# Patient Record
Sex: Male | Born: 1985 | Race: Black or African American | Hispanic: No | Marital: Single | Smoking: Current some day smoker
Health system: Southern US, Community
[De-identification: ages and names within clinical notes are randomized; demographics above are authoritative.]

## PROBLEM LIST (undated history)

## (undated) DIAGNOSIS — S83249A Other tear of medial meniscus, current injury, unspecified knee, initial encounter: Secondary | ICD-10-CM

## (undated) DIAGNOSIS — S83519A Sprain of anterior cruciate ligament of unspecified knee, initial encounter: Secondary | ICD-10-CM

## (undated) DIAGNOSIS — R0981 Nasal congestion: Secondary | ICD-10-CM

## (undated) DIAGNOSIS — G43909 Migraine, unspecified, not intractable, without status migrainosus: Secondary | ICD-10-CM

## (undated) HISTORY — PX: ORIF MANDIBULAR FRACTURE: SHX2127

---

## 2012-11-27 ENCOUNTER — Ambulatory Visit (INDEPENDENT_AMBULATORY_CARE_PROVIDER_SITE_OTHER): Payer: Self-pay

## 2012-11-27 ENCOUNTER — Other Ambulatory Visit: Payer: Self-pay | Admitting: Adult Health

## 2012-11-27 DIAGNOSIS — S83249A Other tear of medial meniscus, current injury, unspecified knee, initial encounter: Secondary | ICD-10-CM

## 2012-11-27 DIAGNOSIS — S83519A Sprain of anterior cruciate ligament of unspecified knee, initial encounter: Secondary | ICD-10-CM

## 2012-11-27 DIAGNOSIS — R52 Pain, unspecified: Secondary | ICD-10-CM

## 2012-11-27 DIAGNOSIS — M25569 Pain in unspecified knee: Secondary | ICD-10-CM

## 2012-11-27 HISTORY — DX: Other tear of medial meniscus, current injury, unspecified knee, initial encounter: S83.249A

## 2012-11-27 HISTORY — DX: Sprain of anterior cruciate ligament of unspecified knee, initial encounter: S83.519A

## 2012-11-30 ENCOUNTER — Encounter: Payer: Self-pay | Admitting: Sports Medicine

## 2012-11-30 ENCOUNTER — Ambulatory Visit (INDEPENDENT_AMBULATORY_CARE_PROVIDER_SITE_OTHER): Payer: Self-pay | Admitting: Sports Medicine

## 2012-11-30 VITALS — BP 128/76 | HR 73 | Wt 171.0 lb

## 2012-11-30 DIAGNOSIS — M25569 Pain in unspecified knee: Secondary | ICD-10-CM

## 2012-11-30 DIAGNOSIS — S83511A Sprain of anterior cruciate ligament of right knee, initial encounter: Secondary | ICD-10-CM | POA: Insufficient documentation

## 2012-11-30 DIAGNOSIS — M25561 Pain in right knee: Secondary | ICD-10-CM

## 2012-11-30 MED ORDER — TRAMADOL HCL 50 MG PO TABS: ORAL_TABLET | ORAL | Status: AC

## 2012-11-30 NOTE — Progress Notes (Signed)
     Subjective:    I'm seeing this patient as a consultation for:  Lidia Collum, NP  CC: Right knee pain  HPI: This is a 27 year old male who presents with knee pain and swelling 2 days after a workplace injury. He was pushing a box when he hit his knee against a metal pole. He heard a "pop" at the time of the impact. There was immediate pain and swelling. He was not able to bear weight initially. He can walk short distances but reports some instability and popping of the joint. He is in significant pain even without weightbearing. The pain is severe and persistent.    Past medical history, Surgical history, Family history not pertinant except as noted below, Social history, Allergies, and medications have been entered into the medical record, reviewed, and no changes needed.   Review of Systems: No headache, visual changes, nausea, vomiting, diarrhea, constipation, dizziness, abdominal pain, skin rash, fevers, chills, night sweats, weight loss, swollen lymph nodes, body aches, joint swelling, muscle aches, chest pain, shortness of breath, mood changes, visual or auditory hallucinations.   Objective:   General: Well Developed, well nourished, and in no acute distress.  Neuro/Psych: Alert and oriented x3, extra-ocular muscles intact, able to move all 4 extremities, sensation grossly intact. Skin: Warm and dry, no rashes noted.  Respiratory: Not using accessory muscles, speaking in full sentences, trachea midline.  Cardiovascular: Pulses palpable, no extremity edema. Abdomen: Does not appear distended. Right Knee: Significant effusion but no obvious bony abnormalities. Tender to palpation along the medial joint line  ROM full in flexion but extension limited by pain. Positive Anterior Drawer sign. Solid consistent endpoints for PCL, LCL, MCL. Negative Mcmurray's, Apley's, and Thessalonian tests. Non painful patellar compression. Patellar glide without crepitus. Patellar and quadriceps  tendons unremarkable. Hamstring and quadriceps strength is normal.  Procedure: Real-time Ultrasound Guided aspiration/Injection of right knee Device: GE Logiq E  Verbal informed consent obtained.  Time-out conducted.  Noted no overlying erythema, induration, or other signs of local infection.  Skin prepped in a sterile fashion.  Local anesthesia: Topical Ethyl chloride.  With sterile technique and under real time ultrasound guidance:  40 cc of serosanguineous fluid aspirated from the right knee, syringe switched in 2 cc Kenalog 40, 4 cc lidocaine injected easily. Completed without difficulty  Pain immediately resolved suggesting accurate placement of the medication.  Advised to call if fevers/chills, erythema, induration, drainage, or persistent bleeding.  Images permanently stored and available for review in the ultrasound unit.  Impression: Technically successful ultrasound guided injection.  Impression and Recommendations:   This case required medical decision making of moderate complexity.  Assessment: This is a 27 year old male with possible ACL tear of the right knee due to trauma.  Plan: 1.Drain effusion today 2. Glucocorticoid injection for pain relief 3. Tramadol as needed for pain 4. MRI 5. Return for follow-up after MRI  This note was originally written by Karin Lieu MS3.

## 2012-11-30 NOTE — Assessment & Plan Note (Signed)
Christopher Bernard is in significant pain, I aspirated and injected the knee for pain relief. I do suspect an anterior cruciate ligament injury, we are going to get an MRI as soon as possible. Knee brace given. Tramadol as needed for pain. Return to see me to go over MRI results.

## 2012-12-04 ENCOUNTER — Other Ambulatory Visit: Payer: Self-pay

## 2012-12-04 ENCOUNTER — Telehealth: Payer: Self-pay

## 2012-12-04 ENCOUNTER — Encounter: Payer: Self-pay | Admitting: Sports Medicine

## 2012-12-04 MED ORDER — HYDROCODONE-ACETAMINOPHEN 5-325 MG PO TABS: 1.0000 | ORAL_TABLET | Freq: Three times a day (TID) | ORAL | Status: DC | PRN

## 2012-12-04 NOTE — Telephone Encounter (Signed)
Patient called stated the he needs a stronger pain medicine because what he was prescribed was not strong enough. Rhonda Cunningham,CMA

## 2012-12-04 NOTE — Telephone Encounter (Signed)
Patient has been notified that Rx was ready for pickup. Rhonda Cunningham,CMA  

## 2012-12-04 NOTE — Telephone Encounter (Signed)
Very short, one time course of hydrocodone prescribed, injection should start working very soon. Prescription is in my box

## 2012-12-08 ENCOUNTER — Ambulatory Visit (HOSPITAL_BASED_OUTPATIENT_CLINIC_OR_DEPARTMENT_OTHER)
Admission: RE | Admit: 2012-12-08 | Discharge: 2012-12-08 | Disposition: A | Discharge status: Home / Self Care | Payer: Worker's Compensation | Source: Ambulatory Visit | Attending: Sports Medicine | Admitting: Sports Medicine

## 2012-12-08 DIAGNOSIS — X58XXXA Exposure to other specified factors, initial encounter: Secondary | ICD-10-CM | POA: Insufficient documentation

## 2012-12-08 DIAGNOSIS — M2240 Chondromalacia patellae, unspecified knee: Secondary | ICD-10-CM | POA: Insufficient documentation

## 2012-12-08 DIAGNOSIS — M25469 Effusion, unspecified knee: Secondary | ICD-10-CM | POA: Insufficient documentation

## 2012-12-08 DIAGNOSIS — S83289A Other tear of lateral meniscus, current injury, unspecified knee, initial encounter: Secondary | ICD-10-CM | POA: Insufficient documentation

## 2012-12-08 DIAGNOSIS — IMO0002 Reserved for concepts with insufficient information to code with codable children: Secondary | ICD-10-CM | POA: Insufficient documentation

## 2012-12-13 ENCOUNTER — Encounter: Payer: Self-pay | Admitting: Sports Medicine

## 2012-12-13 ENCOUNTER — Ambulatory Visit (INDEPENDENT_AMBULATORY_CARE_PROVIDER_SITE_OTHER): Payer: Worker's Compensation | Admitting: Sports Medicine

## 2012-12-13 VITALS — BP 138/79 | HR 63 | Wt 166.0 lb

## 2012-12-13 DIAGNOSIS — Z5189 Encounter for other specified aftercare: Secondary | ICD-10-CM

## 2012-12-13 DIAGNOSIS — S83511D Sprain of anterior cruciate ligament of right knee, subsequent encounter: Secondary | ICD-10-CM

## 2012-12-13 NOTE — Progress Notes (Signed)
  Subjective:    CC: Followup  HPI: This is a very pleasant 27 year old male who suffered a right knee injury on November 27, 2012 at the workplace. He came to see me with swelling, pain, I aspirated hemarthrosis and injected the knee for pain control. He did have a positive Lachman's test and a positive anterior drawer sign so we obtained an MRI. The MRI results we dictated below. His pain is much better but he still has instability as well as mechanical symptoms. He is currently on light duty/seated duty.  Past medical history, Surgical history, Family history not pertinant except as noted below, Social history, Allergies, and medications have been entered into the medical record, reviewed, and no changes needed.   Review of Systems: No fevers, chills, night sweats, weight loss, chest pain, or shortness of breath.   Objective:    General: Well Developed, well nourished, and in no acute distress.  Neuro: Alert and oriented x3, extra-ocular muscles intact, sensation grossly intact.  HEENT: Normocephalic, atraumatic, pupils equal round reactive to light, neck supple, no masses, no lymphadenopathy, thyroid nonpalpable.  Skin: Warm and dry, no rashes. Cardiac: Regular rate and rhythm, no murmurs rubs or gallops, no lower extremity edema.  Respiratory: Clear to auscultation bilaterally. Not using accessory muscles, speaking in full sentences. Right Knee: Tender to palpation at the joint lines, visible and palpable effusion. ROM full in flexion and extension and lower leg rotation. Ligaments with solid consistent endpoints including PCL, LCL, MCL. He does have a positive Lachman's and positive anterior drawer sign. Painful Mcmurray's, negative Apley's, and Thessalonian tests. Non painful patellar compression. Patellar glide without crepitus. Patellar and quadriceps tendons unremarkable. Hamstring and quadriceps strength is normal.   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 There is also some tricompartmental degenerative changes with an effusion.  Impression and Recommendations:

## 2012-12-13 NOTE — Assessment & Plan Note (Signed)
MRI shows anterior cruciate ligament tear with bucket handle tear of the medial meniscus as well as a linear tear in the posterior horn of the lateral meniscus, he also has tricompartmental DJD. I aspirated and injected his knee the last visit for pain control, he returns today feeling much better. Unfortunately his injury is surgical, I'm going to do a referral to Dr. Marcene Corning for consideration of anterior cruciate ligament reconstruction. He can return to see me on an as-needed basis. 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

## 2013-01-16 ENCOUNTER — Other Ambulatory Visit: Payer: Self-pay | Admitting: Orthopedic Surgery

## 2013-01-18 ENCOUNTER — Encounter (HOSPITAL_BASED_OUTPATIENT_CLINIC_OR_DEPARTMENT_OTHER): Payer: Self-pay | Admitting: *Deleted

## 2013-01-18 DIAGNOSIS — R0981 Nasal congestion: Secondary | ICD-10-CM

## 2013-01-18 HISTORY — DX: Nasal congestion: R09.81

## 2013-01-21 NOTE — H&P (Signed)
Christopher Bernard is an 27 y.o. male.   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  Patient states he had immediate pain and swelling in his right knee.  He was seen in [place] by Dr. Benjamin Stain, plain x-rays were nondiagnostic, but MRI scan showed what appeared to be a chronic tear of the ACL, a bucket-handle tear of the medial meniscus lateral meniscal tear as well as some degenerative changes.  The patient reports pain crunching and instability in the knee and is here for definitive treatment.  Past Medical History  Diagnosis Date  . Migraines   . ACL tear 11/27/2012    right knee  . Medial meniscus tear 11/27/2012    right knee  . Nasal congestion 01/18/2013    Past Surgical History  Procedure Laterality Date  . Orif mandibular fracture      History reviewed. No pertinent family history. Social History:  reports that he has been smoking Cigarettes.  He has been smoking about 0.00 packs per day for the past 1 year. He has never used smokeless tobacco. He reports that he drinks alcohol. He reports that he does not use illicit drugs.  Allergies: No Known Allergies  No prescriptions prior to admission    No results found for this or any previous visit (from the past 48 hour(s)). No results found.  Review of Systems  Constitutional: Negative.   HENT: Negative.   Eyes: Negative.   Respiratory: Negative.   Cardiovascular: Negative.   Gastrointestinal: Negative.   Genitourinary: Negative.   Musculoskeletal: Positive for joint pain.  Skin: Negative.   Neurological: Negative.   Endo/Heme/Allergies: Negative.   Psychiatric/Behavioral: Negative.     Height 5\' 10"   (1.778 m), weight 77.111 kg (170 lb). Physical Exam  Constitutional: He is oriented to person, place, and time. He appears well-developed and well-nourished.  HENT:  Head: Normocephalic and atraumatic.  Eyes: Pupils are equal, round, and reactive to light.  Neck: Normal range of motion. Neck supple.  Cardiovascular: Intact distal pulses.   Respiratory: Effort normal.  Musculoskeletal:  The right knee is one plus effusion, collateral ligaments are stable Lachman's test is grossly positive pivot shift test cannot be accomplished secondary to guarding there is crepitus and crunching along the medial side of the knee.  As you taken through a range of motion.  Plain x-rays reviewed and are nondiagnostic, MRI scan is reviewed and does show a bucket-handle tear the medial meniscus a horizontal cleavage tear of the lateral meniscus and what appears to be an absent ACL.  Neurological: He is alert and oriented to person, place, and time.  Skin: Skin is warm and dry.  Psychiatric: He has a normal mood and affect. His behavior is normal. Judgment and thought content normal.     Assessment/Plan Assess: Right knee symptomatic ACL tear with medial meniscal bucket-handle tear and lateral meniscal horizontal cleavage tear  Plan:  The patient is 27 years old and states this is the only injury he ever had to his knee.  Surgical intervention to include autograft ACL reconstruction, medial meniscal repair or debridement.  Based on the condition of the meniscus and lateral meniscus, debridement was discussed with  the patient and is formally requested.  The patient will be out of work regular duty for 3 months after surgery, but should be qualified for light duty after 4-6 weeks.  He'll continue ibuprofen for discomfort and he'll continue seated work with frequent changes in position work restriction.  Until his knee is fixed he should specifically avoid any running, jumping or cutting activities.  PHILLIPS, ERIC  R 01/21/2013, 2:51 PM

## 2013-01-28 ENCOUNTER — Ambulatory Visit (HOSPITAL_BASED_OUTPATIENT_CLINIC_OR_DEPARTMENT_OTHER)
Admission: RE | Admit: 2013-01-28 | Discharge: 2013-01-28 | Disposition: A | Discharge status: Home / Self Care | Payer: Worker's Compensation | Source: Ambulatory Visit | Attending: Orthopedic Surgery | Admitting: Orthopedic Surgery

## 2013-01-28 ENCOUNTER — Ambulatory Visit (HOSPITAL_BASED_OUTPATIENT_CLINIC_OR_DEPARTMENT_OTHER): Payer: Worker's Compensation | Admitting: Certified Registered"

## 2013-01-28 ENCOUNTER — Encounter (HOSPITAL_BASED_OUTPATIENT_CLINIC_OR_DEPARTMENT_OTHER): Payer: Self-pay | Admitting: Certified Registered"

## 2013-01-28 ENCOUNTER — Encounter (HOSPITAL_BASED_OUTPATIENT_CLINIC_OR_DEPARTMENT_OTHER): Payer: Worker's Compensation | Admitting: Certified Registered"

## 2013-01-28 ENCOUNTER — Encounter (HOSPITAL_BASED_OUTPATIENT_CLINIC_OR_DEPARTMENT_OTHER)
Admission: RE | Disposition: A | Discharge status: Home / Self Care | Payer: Self-pay | Source: Ambulatory Visit | Attending: Orthopedic Surgery

## 2013-01-28 DIAGNOSIS — S83509A Sprain of unspecified cruciate ligament of unspecified knee, initial encounter: Secondary | ICD-10-CM | POA: Insufficient documentation

## 2013-01-28 DIAGNOSIS — IMO0002 Reserved for concepts with insufficient information to code with codable children: Secondary | ICD-10-CM | POA: Insufficient documentation

## 2013-01-28 DIAGNOSIS — S83511A Sprain of anterior cruciate ligament of right knee, initial encounter: Secondary | ICD-10-CM

## 2013-01-28 DIAGNOSIS — S83289A Other tear of lateral meniscus, current injury, unspecified knee, initial encounter: Secondary | ICD-10-CM | POA: Insufficient documentation

## 2013-01-28 DIAGNOSIS — F17200 Nicotine dependence, unspecified, uncomplicated: Secondary | ICD-10-CM | POA: Insufficient documentation

## 2013-01-28 HISTORY — DX: Other tear of medial meniscus, current injury, unspecified knee, initial encounter: S83.249A

## 2013-01-28 HISTORY — DX: Nasal congestion: R09.81

## 2013-01-28 HISTORY — DX: Migraine, unspecified, not intractable, without status migrainosus: G43.909

## 2013-01-28 HISTORY — DX: Sprain of anterior cruciate ligament of unspecified knee, initial encounter: S83.519A

## 2013-01-28 HISTORY — PX: KNEE ARTHROSCOPY WITH MEDIAL MENISECTOMY: SHX5651

## 2013-01-28 HISTORY — PX: ANTERIOR CRUCIATE LIGAMENT REPAIR: SHX115

## 2013-01-28 SURGERY — RECONSTRUCTION, KNEE, ACL
Anesthesia: General | Site: Knee | Laterality: Right

## 2013-01-28 MED ORDER — MIDAZOLAM HCL 2 MG/2ML IJ SOLN
1.0000 mg | INTRAMUSCULAR | Status: DC | PRN
  Administered 2013-01-28: 2 mg via INTRAVENOUS

## 2013-01-28 MED ORDER — OXYCODONE HCL 5 MG/5ML PO SOLN
5.0000 mg | Freq: Once | ORAL | Status: AC | PRN
Start: 2013-01-28 — End: 2013-01-28

## 2013-01-28 MED ORDER — DEXAMETHASONE SODIUM PHOSPHATE 10 MG/ML IJ SOLN
INTRAMUSCULAR | Status: DC | PRN
  Administered 2013-01-28: 10 mg via INTRAVENOUS

## 2013-01-28 MED ORDER — LIDOCAINE-EPINEPHRINE (PF) 1 %-1:200000 IJ SOLN
INTRAMUSCULAR | Status: AC
  Filled 2013-01-28: qty 10

## 2013-01-28 MED ORDER — FENTANYL CITRATE 0.05 MG/ML IJ SOLN
INTRAMUSCULAR | Status: DC | PRN
  Administered 2013-01-28 (×2): 25 ug via INTRAVENOUS
  Administered 2013-01-28: 50 ug via INTRAVENOUS
  Administered 2013-01-28: 25 ug via INTRAVENOUS
  Administered 2013-01-28: 50 ug via INTRAVENOUS
  Administered 2013-01-28 (×2): 25 ug via INTRAVENOUS

## 2013-01-28 MED ORDER — OXYCODONE-ACETAMINOPHEN 5-325 MG PO TABS: 1.0000 | ORAL_TABLET | ORAL | Status: AC | PRN

## 2013-01-28 MED ORDER — SODIUM CHLORIDE 0.9 % IR SOLN
Status: DC | PRN
  Administered 2013-01-28: 09:00:00

## 2013-01-28 MED ORDER — HYDROMORPHONE HCL PF 1 MG/ML IJ SOLN: 0.2500 mg | INTRAMUSCULAR | Status: DC | PRN

## 2013-01-28 MED ORDER — FENTANYL CITRATE 0.05 MG/ML IJ SOLN
50.0000 ug | INTRAMUSCULAR | Status: DC | PRN
  Administered 2013-01-28: 100 ug via INTRAVENOUS

## 2013-01-28 MED ORDER — PROMETHAZINE HCL 25 MG/ML IJ SOLN: 6.2500 mg | INTRAMUSCULAR | Status: DC | PRN

## 2013-01-28 MED ORDER — ONDANSETRON HCL 4 MG/2ML IJ SOLN
INTRAMUSCULAR | Status: DC | PRN
  Administered 2013-01-28: 4 mg via INTRAVENOUS

## 2013-01-28 MED ORDER — PROPOFOL 10 MG/ML IV EMUL
INTRAVENOUS | Status: AC
  Filled 2013-01-28: qty 50

## 2013-01-28 MED ORDER — BUPIVACAINE HCL (PF) 0.5 % IJ SOLN
INTRAMUSCULAR | Status: AC
  Filled 2013-01-28: qty 30

## 2013-01-28 MED ORDER — EPINEPHRINE HCL 1 MG/ML IJ SOLN
INTRAMUSCULAR | Status: AC
  Filled 2013-01-28: qty 1

## 2013-01-28 MED ORDER — FENTANYL CITRATE 0.05 MG/ML IJ SOLN
INTRAMUSCULAR | Status: AC
  Filled 2013-01-28: qty 8

## 2013-01-28 MED ORDER — MIDAZOLAM HCL 2 MG/ML PO SYRP: 12.0000 mg | ORAL_SOLUTION | Freq: Once | ORAL | Status: DC | PRN

## 2013-01-28 MED ORDER — OXYCODONE HCL 5 MG PO TABS
ORAL_TABLET | ORAL | Status: AC
  Filled 2013-01-28: qty 1

## 2013-01-28 MED ORDER — DEXTROSE-NACL 5-0.45 % IV SOLN: INTRAVENOUS | Status: DC

## 2013-01-28 MED ORDER — MIDAZOLAM HCL 5 MG/5ML IJ SOLN
INTRAMUSCULAR | Status: DC | PRN
  Administered 2013-01-28: 2 mg via INTRAVENOUS

## 2013-01-28 MED ORDER — CEFAZOLIN SODIUM-DEXTROSE 2-3 GM-% IV SOLR
2.0000 g | INTRAVENOUS | Status: AC
  Administered 2013-01-28: 2 g via INTRAVENOUS

## 2013-01-28 MED ORDER — MIDAZOLAM HCL 2 MG/2ML IJ SOLN
INTRAMUSCULAR | Status: AC
  Filled 2013-01-28: qty 2

## 2013-01-28 MED ORDER — CEFAZOLIN SODIUM 1-5 GM-% IV SOLN
INTRAVENOUS | Status: AC
  Filled 2013-01-28: qty 100

## 2013-01-28 MED ORDER — OXYCODONE HCL 5 MG PO TABS
5.0000 mg | ORAL_TABLET | Freq: Once | ORAL | Status: AC | PRN
  Administered 2013-01-28: 5 mg via ORAL

## 2013-01-28 MED ORDER — LIDOCAINE HCL (CARDIAC) 20 MG/ML IV SOLN
INTRAVENOUS | Status: DC | PRN
  Administered 2013-01-28: 60 mg via INTRAVENOUS

## 2013-01-28 MED ORDER — CHLORHEXIDINE GLUCONATE 4 % EX LIQD
60.0000 mL | Freq: Once | CUTANEOUS | Status: AC
  Administered 2013-01-28: 4 via TOPICAL

## 2013-01-28 MED ORDER — BUPIVACAINE-EPINEPHRINE PF 0.5-1:200000 % IJ SOLN
INTRAMUSCULAR | Status: DC | PRN
  Administered 2013-01-28: 30 mL via PERINEURAL

## 2013-01-28 MED ORDER — FENTANYL CITRATE 0.05 MG/ML IJ SOLN
INTRAMUSCULAR | Status: AC
  Filled 2013-01-28: qty 2

## 2013-01-28 MED ORDER — BUPIVACAINE-EPINEPHRINE PF 0.5-1:200000 % IJ SOLN
INTRAMUSCULAR | Status: AC
  Filled 2013-01-28: qty 30

## 2013-01-28 MED ORDER — LIDOCAINE HCL (PF) 1 % IJ SOLN
INTRAMUSCULAR | Status: AC
  Filled 2013-01-28: qty 30

## 2013-01-28 MED ORDER — PROPOFOL 10 MG/ML IV BOLUS
INTRAVENOUS | Status: DC | PRN
  Administered 2013-01-28: 200 mg via INTRAVENOUS

## 2013-01-28 MED ORDER — LACTATED RINGERS IV SOLN
INTRAVENOUS | Status: DC
  Administered 2013-01-28 (×2): via INTRAVENOUS

## 2013-01-28 SURGICAL SUPPLY — 75 items
BANDAGE ELASTIC 4 VELCRO ST LF (GAUZE/BANDAGES/DRESSINGS) ×2 IMPLANT
BANDAGE ELASTIC 6 VELCRO ST LF (GAUZE/BANDAGES/DRESSINGS) ×2 IMPLANT
BLADE 4.2CUDA (BLADE) IMPLANT
BLADE AVERAGE 25X9 (BLADE) IMPLANT
BLADE CUTTER GATOR 3.5 (BLADE) IMPLANT
BLADE GREAT WHITE 4.2 (BLADE) ×2 IMPLANT
BLADE OSCIL/SAGITTAL W/10 ST (BLADE) ×2 IMPLANT
BLADE SURG 10 STRL SS (BLADE) ×4 IMPLANT
BLADE SURG 15 STRL LF DISP TIS (BLADE) ×1 IMPLANT
BLADE SURG 15 STRL SS (BLADE) ×1
BNDG ESMARK 4X9 LF (GAUZE/BANDAGES/DRESSINGS) IMPLANT
BUR VERTEX HOODED 4.5 (BURR) ×2 IMPLANT
CANISTER SUCT 3000ML (MISCELLANEOUS) IMPLANT
CANISTER SUCT LVC 12 LTR MEDI- (MISCELLANEOUS) ×2 IMPLANT
CHLORAPREP W/TINT 26ML (MISCELLANEOUS) ×2 IMPLANT
COVER TABLE BACK 60X90 (DRAPES) ×2 IMPLANT
CUFF TOURNIQUET SINGLE 34IN LL (TOURNIQUET CUFF) IMPLANT
DECANTER SPIKE VIAL GLASS SM (MISCELLANEOUS) IMPLANT
DRAPE ARTHROSCOPY W/POUCH 114 (DRAPES) ×2 IMPLANT
ELECT MENISCUS 165MM 90D (ELECTRODE) IMPLANT
ELECT REM PT RETURN 9FT ADLT (ELECTROSURGICAL) ×2
ELECTRODE REM PT RTRN 9FT ADLT (ELECTROSURGICAL) ×1 IMPLANT
GAUZE SPONGE 4X4 16PLY XRAY LF (GAUZE/BANDAGES/DRESSINGS) ×2 IMPLANT
GAUZE XEROFORM 1X8 LF (GAUZE/BANDAGES/DRESSINGS) ×2 IMPLANT
GLOVE BIO SURGEON STRL SZ 6 (GLOVE) IMPLANT
GLOVE BIO SURGEON STRL SZ7.5 (GLOVE) ×2 IMPLANT
GLOVE BIO SURGEON STRL SZ8.5 (GLOVE) ×4 IMPLANT
GLOVE BIOGEL PI IND STRL 7.0 (GLOVE) ×1 IMPLANT
GLOVE BIOGEL PI IND STRL 8 (GLOVE) ×1 IMPLANT
GLOVE BIOGEL PI IND STRL 9 (GLOVE) ×1 IMPLANT
GLOVE BIOGEL PI INDICATOR 7.0 (GLOVE) ×1
GLOVE BIOGEL PI INDICATOR 8 (GLOVE) ×1
GLOVE BIOGEL PI INDICATOR 9 (GLOVE) ×1
GLOVE ECLIPSE 6.5 STRL STRAW (GLOVE) ×2 IMPLANT
GLOVE EXAM NITRILE PF MED BLUE (GLOVE) ×2 IMPLANT
GOWN PREVENTION PLUS XLARGE (GOWN DISPOSABLE) ×4 IMPLANT
GOWN PREVENTION PLUS XXLARGE (GOWN DISPOSABLE) ×2 IMPLANT
IMMOBILIZER KNEE 22 UNIV (SOFTGOODS) IMPLANT
IMMOBILIZER KNEE 24 THIGH 36 (MISCELLANEOUS) IMPLANT
IMMOBILIZER KNEE 24 UNIV (MISCELLANEOUS)
IV NS IRRIG 3000ML ARTHROMATIC (IV SOLUTION) ×4 IMPLANT
KNEE WRAP E Z 3 GEL PACK (MISCELLANEOUS) ×2 IMPLANT
NDL SAFETY ECLIPSE 18X1.5 (NEEDLE) ×1 IMPLANT
NEEDLE HYPO 18GX1.5 SHARP (NEEDLE) ×1
NEEDLE MENISCAL REPAIR W/EYELT (NEEDLE) IMPLANT
NS IRRIG 1000ML POUR BTL (IV SOLUTION) IMPLANT
PACK ARTHROSCOPY DSU (CUSTOM PROCEDURE TRAY) ×2 IMPLANT
PACK BASIN DAY SURGERY FS (CUSTOM PROCEDURE TRAY) ×2 IMPLANT
PAD CAST 4YDX4 CTTN HI CHSV (CAST SUPPLIES) ×1 IMPLANT
PADDING CAST COTTON 4X4 STRL (CAST SUPPLIES) ×1
PENCIL BUTTON HOLSTER BLD 10FT (ELECTRODE) ×2 IMPLANT
SCREW PROPEL 7X20MM (Screw) ×4 IMPLANT
SET ARTHROSCOPY TUBING (MISCELLANEOUS) ×1
SET ARTHROSCOPY TUBING LN (MISCELLANEOUS) ×1 IMPLANT
SLEEVE SCD COMPRESS KNEE MED (MISCELLANEOUS) ×2 IMPLANT
SPONGE GAUZE 4X4 12PLY (GAUZE/BANDAGES/DRESSINGS) ×2 IMPLANT
SPONGE LAP 4X18 X RAY DECT (DISPOSABLE) ×4 IMPLANT
STAPLER VISISTAT 35W (STAPLE) IMPLANT
SUCTION FRAZIER TIP 10 FR DISP (SUCTIONS) IMPLANT
SUT ETHILON 5 0 PS 2 18 (SUTURE) IMPLANT
SUT FIBERWIRE #2 38 T-5 BLUE (SUTURE)
SUT MON AB 4-0 PC3 18 (SUTURE) ×2 IMPLANT
SUT PDS AB 4-0 P3 18 (SUTURE) IMPLANT
SUT STEEL 5 (SUTURE) ×2 IMPLANT
SUT VIC AB 2-0 SH 27 (SUTURE) ×1
SUT VIC AB 2-0 SH 27XBRD (SUTURE) ×1 IMPLANT
SUT VIC AB 3-0 PS1 18 (SUTURE)
SUT VIC AB 3-0 PS1 18XBRD (SUTURE) IMPLANT
SUTURE FIBERWR #2 38 T-5 BLUE (SUTURE) IMPLANT
SYR 3ML 18GX1 1/2 (SYRINGE) IMPLANT
SYR 5ML LL (SYRINGE) ×2 IMPLANT
TOWEL OR 17X24 6PK STRL BLUE (TOWEL DISPOSABLE) ×6 IMPLANT
WAND STAR VAC 90 (SURGICAL WAND) ×2 IMPLANT
WATER STERILE IRR 1000ML POUR (IV SOLUTION) ×2 IMPLANT
YANKAUER SUCT BULB TIP NO VENT (SUCTIONS) IMPLANT

## 2013-01-28 NOTE — Interval H&P Note (Signed)
History and Physical Interval Note:  01/28/2013 7:22 AM  Christopher Bernard  has presented today for surgery, with the diagnosis of right ACL tear and Medial Meniscal Tear  The various methods of treatment have been discussed with the patient and family. After consideration of risks, benefits and other options for treatment, the patient has consented to  Procedure(s): RECONSTRUCTION ANTERIOR CRUCIATE LIGAMENT (ACL) RIGHT  AUTOGRAFT (Right) RIGHT KNEE ARTHROSCOPY WITH PARTIAL MEDIAL MENISECTOMY (Right) as a surgical intervention .  The patient's history has been reviewed, patient examined, no change in status, stable for surgery.  I have reviewed the patient's chart and labs.  Questions were answered to the patient's satisfaction.     [name]

## 2013-01-28 NOTE — Transfer of Care (Signed)
Immediate Anesthesia Transfer of Care Note  Patient: Christopher Bernard  Procedure(s) Performed: Procedure(s): RECONSTRUCTION ANTERIOR CRUCIATE LIGAMENT (ACL) RIGHT  AUTOGRAFT (Right) RIGHT KNEE ARTHROSCOPY WITH PARTIAL MEDIAL MENISECTOMY, Partial Lateral Menisectomy (Right)  Patient Location: PACU  Anesthesia Type:GA combined with regional for post-op pain  Level of Consciousness: awake, alert , oriented and patient cooperative  Airway & Oxygen Therapy: Patient Spontanous Breathing and Patient connected to face mask oxygen  Post-op Assessment: Report given to PACU RN and Post -op Vital signs reviewed and stable  Post vital signs: Reviewed and stable  Complications: No apparent anesthesia complications

## 2013-01-28 NOTE — Progress Notes (Signed)
Assisted Dr. Massagee with right, ultrasound guided, femoral block. Side rails up, monitors on throughout procedure. See vital signs in flow sheet. Tolerated Procedure well. 

## 2013-01-28 NOTE — Op Note (Signed)
Pre-Op Dx: Right knee anterior cruciate ligament tear, medial meniscal tear bucket-handle, lateral meniscus tear complex double parrot-beak   Postop Dx: Same   Procedure: Left knee partial arthroscopic medial meniscectomy, lateral meniscectomy, autograft anterior cruciate ligament reconstruction with a 11 mm wide autograft and 2 7 x 20 Linvatec propel titanium screws for fixation  Surgeon: Feliberto Gottron. Turner Daniels M.D.  Assist: Tomi Likens. Gaylene Brooks  (present throughout entire procedure and necessary for timely completion of the procedure) Anes: General LMA  EBL: Minimal  Fluids: 800 cc   Indications: Patient was injured at work MRI scan showed what appeared to be a chronic anterior cruciate ligament tear bucket-handle tear the medial meniscus and lateral meniscal tear. Patient has catching popping and pain in his knee. Pt has failed conservative treatment with anti-inflammatory medicines, physical therapy, and modified activites but did get good temporarily from an intra-articular cortisone injection. Pain has recurred and patient desires elective arthroscopic evaluation and treatment of knee. This will include autograft anterior cruciate ligament reconstruction and repair or removal of the meniscal tears based on the complexity and chronicity. Risks and benefits of surgery have been discussed and questions answered.  Procedure: Patient identified by arm band and taken to the operating room at the day surgery Center. The appropriate anesthetic monitors were attached, and General LMA anesthesia was induced without difficulty. Lateral post was applied to the table and the lower extremity was prepped and draped in usual sterile fashion from the ankle to the midthigh. Time out procedure was performed. We began the operation by making standard inferior lateral and inferior medial peripatellar portals with a #11 blade allowing introduction of the arthroscope through the inferior lateral portal and the out flow to the  inferior medial portal. Pump pressure was set at 100 mmHg and diagnostic arthroscopy  revealed a normal patellofemoral joint, moving into the medial compartment the patient had an old white on white bucket-handle tear the medial meniscus was displaced and had some atrophy of the meniscus. The meniscal tear was removed with the arthroscopic scissors 11 blade straight biter and a 42 great white sucker shaver. The outer 20% of the meniscus was intact. Moving into the notch the patient did have a chronic anterior cruciate ligament tear with an empty lateral wall sign. On the lateral side he had a complex double parrot-beak tear again this appeared to be chronic and this was removed piecemeal with a straight biter side biter 42 gray-white sucker shaver and a 35 Gator sucker shaver. The knee was then flexed to 50 and a Stulberg positioner and using a 45 hooded vortex bur a superolateral notchplasty was accomplished with removal of notch osteophytes are 1-2 mm in size. We then harvested 11 mm wide bone tendon bone graft utilizing the anterior incision that went from the patella to 1 cm medial to and 1 cm distal to the tibial tubercle. Small bleeders in the skin and subcutaneous tissue identified and cauterized the patellar tendon was measured at 34 mm we then proceeded to harvest the graft with 20 mm bone plugs at either end the patellar bone plug after being trimmed to fit through the 8 mm tube and the tibial bone plug fit through the 9 mm tube. Drill holes are placed each bone plug with a #2 FiberWire and the patellar bone plug and a 20-gauge steel wire and the tibial bone plug. With the knee held at 50 flexion we then used the Linvatec tibial pin positioning guide to place a tunnel from the  proximal medial flare of the tibia to the posterior aspect of the anterior cruciate ligament footprint and overreamed with a 9 mm badger reamer. The 7 mm over-the-top guide was placed the tibial tunnel and a second guidepin placed  at the 10 to 10:30 position and overreamed with an 8 mm badger reamer to a depth of 35 mm. A superior notch was placed in the femoral tunnel and a lateral notch and the tibial tunnel the Beath pin was then drilled through the femoral tunnel and out the superior lateral side of the femur allowing [place] to pull the graft through the tibial tunnel into the femoral tunnel patellar bone plug first with good fit and fill. The knee was hyperflexed 110 degrees and utilizing a supplemental inferomedial portal and the wire was placed between the patellar bone plug in the femoral tunnel and then fixed with a 7 x 20 Linvatec propel screw using a graft protector. The knee was then placed in 40 of flexion and with reverse Lachman's maneuver a second 7 x 20 screw was placed between the tibial bone plug in the tibia over a second Nitinol wire. Excellent graft tension was noted the knee was taken through range of motion from 110-45-0 there was no impingement noted and graft tension was excellent the. At this point the wound was irrigated out normal saline solution a 20-gauge steel wire used for traction on the graft was cut and removed as was the #2 FiberWire. The wound is closed in layers with 3-0 Vicryl suture in the retinaculum subcutaneous tissue and subcuticular. The knee was irrigated out normal saline solution. A dressing of xerofoam 4 x 4 dressing sponges, web roll and an Ace wrap was applied. The patient was awakened extubated and taken to the recovery without difficulty.    Signed: [name], MD

## 2013-01-28 NOTE — Anesthesia Postprocedure Evaluation (Signed)
  Anesthesia Post-op Note  Patient: Christopher Bernard  Procedure(s) Performed: Procedure(s): RECONSTRUCTION ANTERIOR CRUCIATE LIGAMENT (ACL) RIGHT  AUTOGRAFT (Right) RIGHT KNEE ARTHROSCOPY WITH PARTIAL MEDIAL MENISECTOMY, Partial Lateral Menisectomy (Right)  Patient Location: PACU  Anesthesia Type:GA combined with regional for post-op pain  Level of Consciousness: awake and alert   Airway and Oxygen Therapy: Patient Spontanous Breathing  Post-op Pain: none  Post-op Assessment: Post-op Vital signs reviewed  Post-op Vital Signs: stable  Complications: No apparent anesthesia complications

## 2013-01-28 NOTE — Anesthesia Procedure Notes (Addendum)
Anesthesia Regional Block:  Femoral nerve block  Pre-Anesthetic Checklist: ,, timeout performed, Correct Patient, Correct Site, Correct Laterality, Correct Procedure, Correct Position, site marked, Risks and benefits discussed, at surgeon's request and post-op pain management  Laterality: Right and Upper  Prep: chloraprep       Needles:  Injection technique: Single-shot  Needle Type: Echogenic Needle      Needle Gauge: 22 and 22 G  Needle insertion depth: 6 cm   Additional Needles:  Procedures: nerve stimulator Femoral nerve block  Nerve Stimulator or Paresthesia:  Response: Twitch elicited, 0.8 mA,   Additional Responses:   Narrative:  Start time: 01/28/2013 7:10 AM End time: 01/28/2013 7:24 AM Injection made incrementally with aspirations every 5 mL.  Performed by: Personally  Anesthesiologist: Alma Friendly, MD  Additional Notes: BP cuff, EKG monitors applied. Sedation begun. Femoral artery palpated for location of nerve. After nerve location anesthetic injected incrementally, slowly , and after neg aspirations. Tolerated well.   Procedure Name: LMA Insertion Date/Time: 01/28/2013 7:36 AM Performed by: Patryk Conant Pre-anesthesia Checklist: Patient identified, Emergency Drugs available, Suction available and Patient being monitored Patient Re-evaluated:Patient Re-evaluated prior to inductionOxygen Delivery Method: Circle System Utilized Preoxygenation: Pre-oxygenation with 100% oxygen Intubation Type: IV induction Ventilation: Mask ventilation without difficulty LMA: LMA inserted LMA Size: 4.0 Number of attempts: 1 Airway Equipment and Method: bite block Placement Confirmation: positive ETCO2 Tube secured with: Tape Dental Injury: Teeth and Oropharynx as per pre-operative assessment

## 2013-01-28 NOTE — Anesthesia Preprocedure Evaluation (Addendum)
Anesthesia Evaluation  Patient identified by MRN, date of birth, ID band Patient awake    Reviewed: Allergy & Precautions, NPO status   Airway Mallampati: I      Dental  (+) Teeth Intact   Pulmonary Current Smoker,  breath sounds clear to auscultation        Cardiovascular Rhythm:Regular Rate:Normal     Neuro/Psych  Headaches,    GI/Hepatic   Endo/Other    Renal/GU      Musculoskeletal   Abdominal   Peds  Hematology   Anesthesia Other Findings   Reproductive/Obstetrics                         Anesthesia Physical Anesthesia Plan  ASA: II  Anesthesia Plan: General   Post-op Pain Management:    Induction: Intravenous  Airway Management Planned: LMA and Oral ETT  Additional Equipment:   Intra-op Plan:   Post-operative Plan: Extubation in OR  Informed Consent: I have reviewed the patients History and Physical, chart, labs and discussed the procedure including the risks, benefits and alternatives for the proposed anesthesia with the patient or authorized representative who has indicated his/her understanding and acceptance.     Plan Discussed with: CRNA and Surgeon  Anesthesia Plan Comments:        Anesthesia Quick Evaluation

## 2013-01-29 ENCOUNTER — Encounter (HOSPITAL_BASED_OUTPATIENT_CLINIC_OR_DEPARTMENT_OTHER): Payer: Self-pay | Admitting: Orthopedic Surgery

## 2015-02-05 IMAGING — CR DG KNEE COMPLETE 4+V*R*
4 series · 4 of 4 positions shown · non-contrast
Comparison: None.

CLINICAL DATA: Right knee pain post injury

EXAM:
RIGHT KNEE - COMPLETE 4+ VIEW

[view not recorded (1 of 4)]
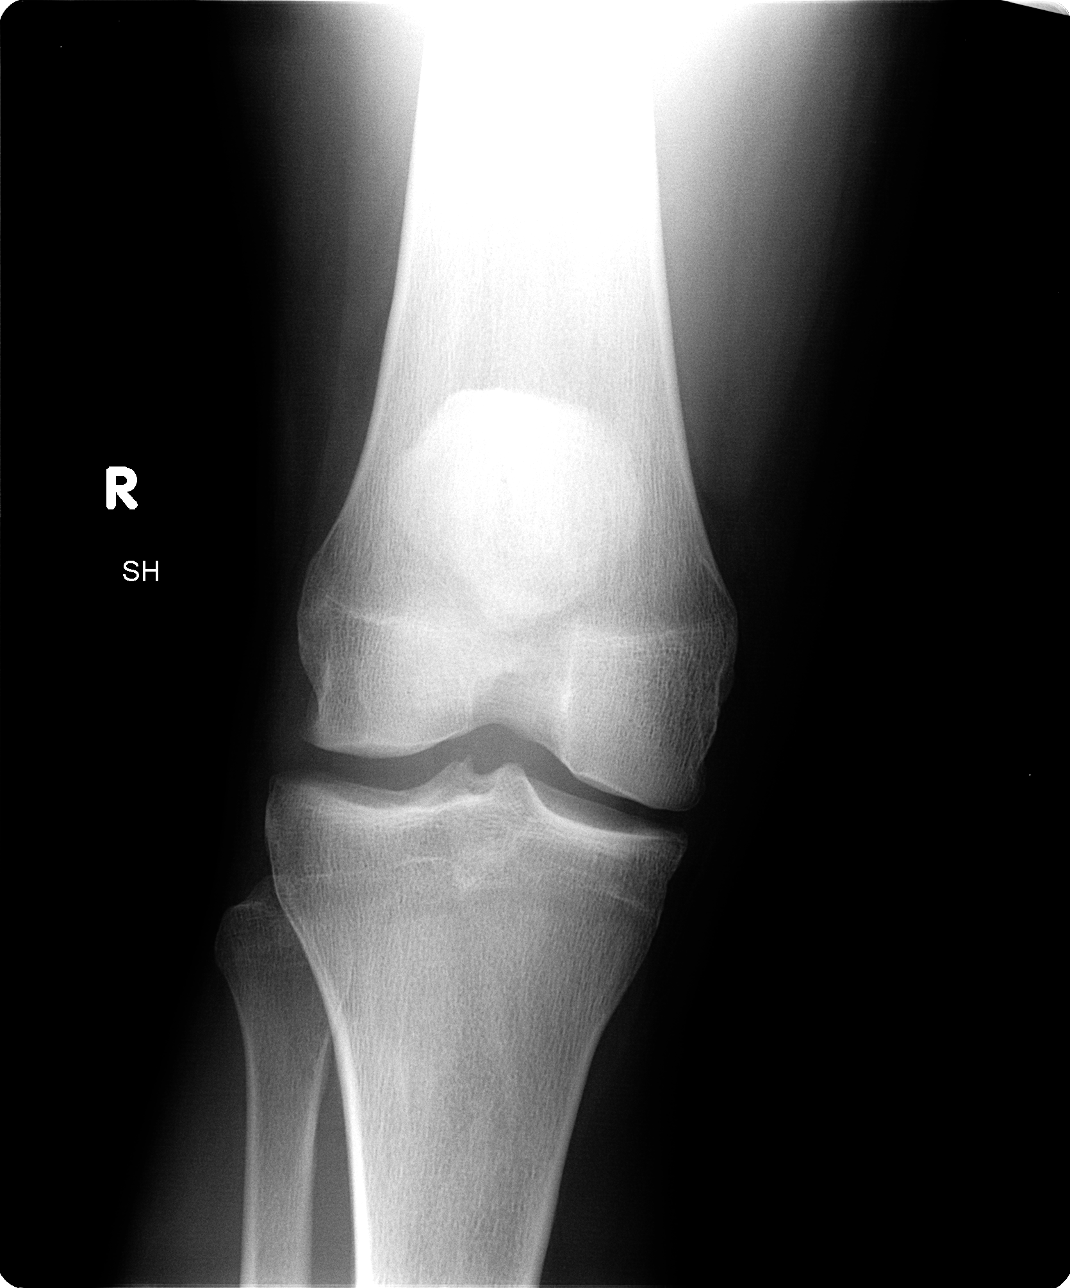

[view not recorded (2 of 4)]
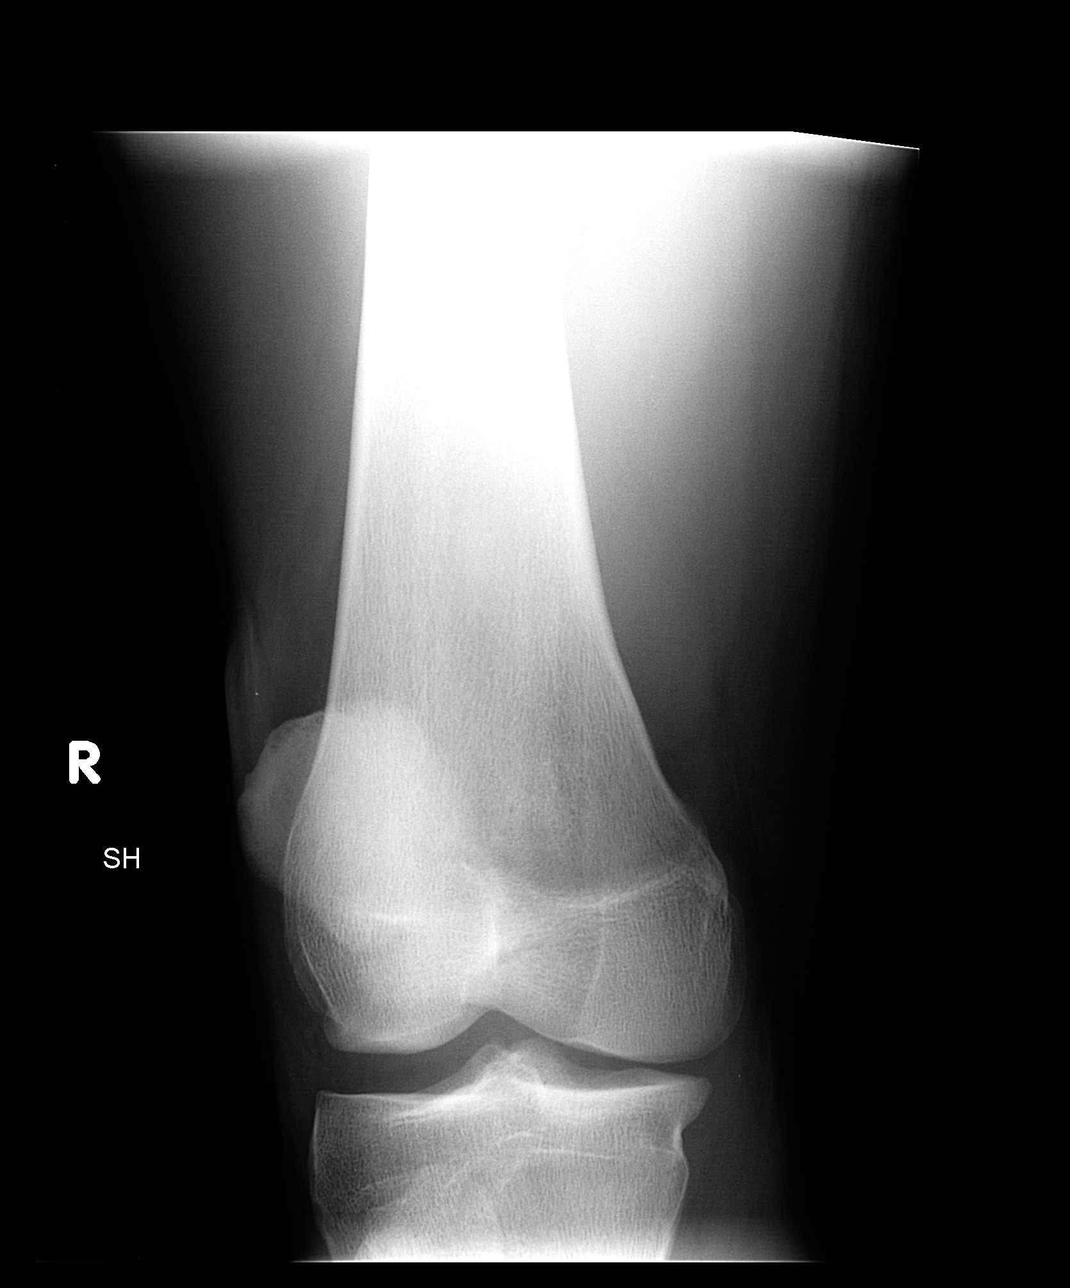

[view not recorded (3 of 4)]
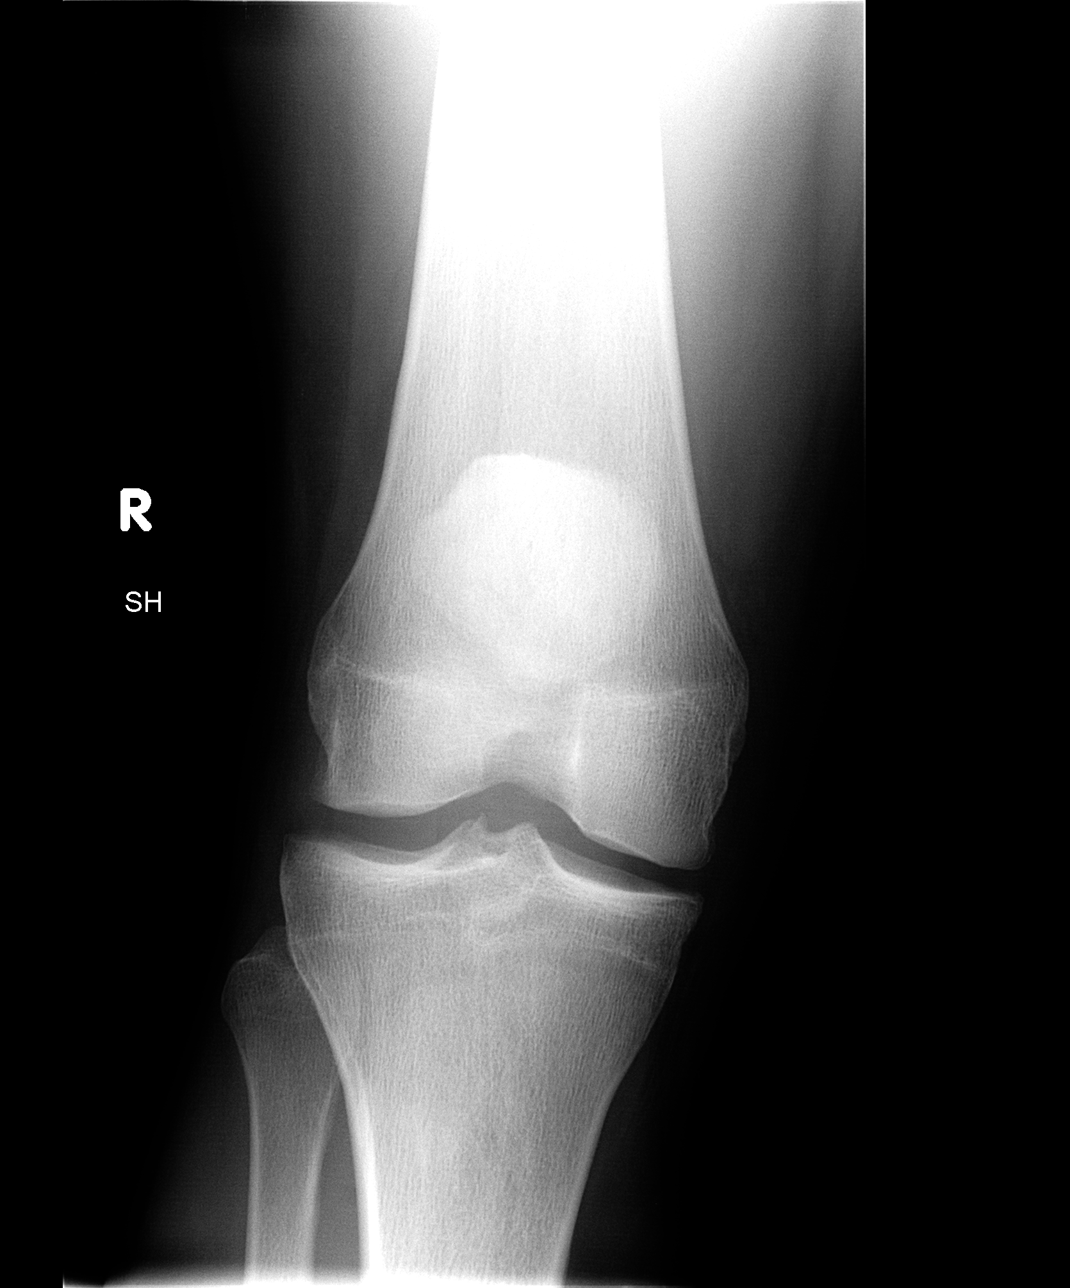

[view not recorded (4 of 4)]
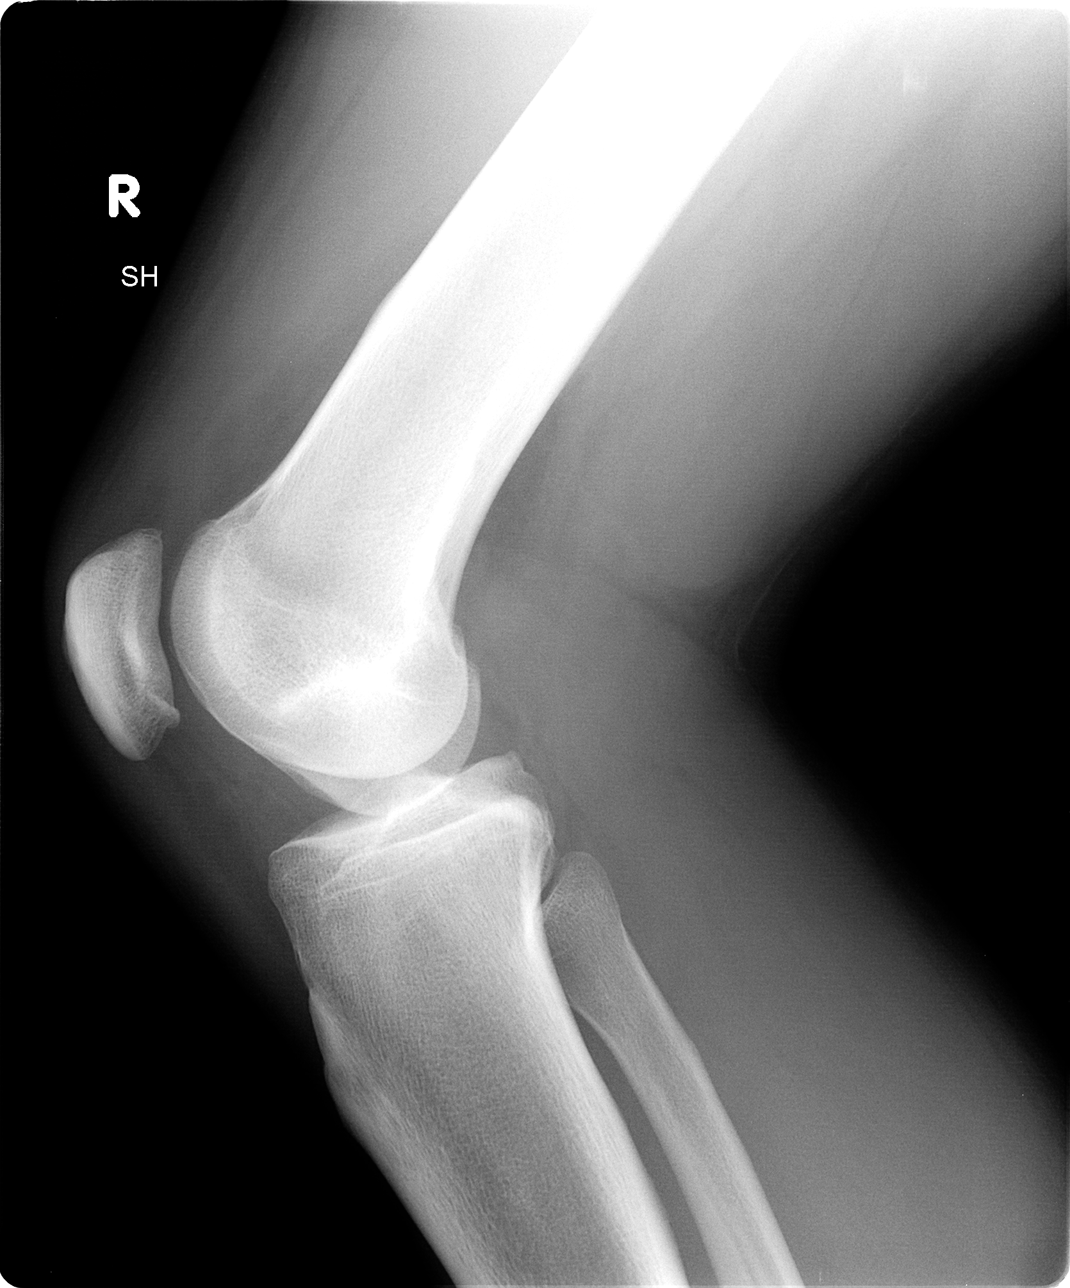

[4 of 4 positions shown; findings below may reference images not displayed]

FINDINGS: Four views of the right knee submitted. No acute fracture or
subluxation. No radiopaque foreign body. No joint effusion.
IMPRESSION: Negative.
